# Patient Record
Sex: Male | Born: 1950 | Race: White | Hispanic: No | Marital: Married | Smoking: Never smoker
Health system: Southern US, Community
[De-identification: ages and names within clinical notes are randomized; demographics above are authoritative.]

## PROBLEM LIST (undated history)

## (undated) DIAGNOSIS — I2510 Atherosclerotic heart disease of native coronary artery without angina pectoris: Secondary | ICD-10-CM

## (undated) HISTORY — PX: CARDIAC SURGERY: SHX584

---

## 2019-08-21 ENCOUNTER — Emergency Department (HOSPITAL_COMMUNITY)
Admission: EM | Admit: 2019-08-21 | Discharge: 2019-08-22 | Disposition: A | Discharge status: Home / Self Care | Payer: Medicare Other | Attending: Emergency Medicine | Admitting: Emergency Medicine

## 2019-08-21 ENCOUNTER — Other Ambulatory Visit: Payer: Self-pay

## 2019-08-21 ENCOUNTER — Encounter (HOSPITAL_COMMUNITY): Payer: Self-pay | Admitting: Emergency Medicine

## 2019-08-21 DIAGNOSIS — H81393 Other peripheral vertigo, bilateral: Secondary | ICD-10-CM | POA: Diagnosis not present

## 2019-08-21 DIAGNOSIS — R110 Nausea: Secondary | ICD-10-CM | POA: Diagnosis not present

## 2019-08-21 DIAGNOSIS — J302 Other seasonal allergic rhinitis: Secondary | ICD-10-CM | POA: Insufficient documentation

## 2019-08-21 DIAGNOSIS — I2510 Atherosclerotic heart disease of native coronary artery without angina pectoris: Secondary | ICD-10-CM | POA: Insufficient documentation

## 2019-08-21 DIAGNOSIS — H538 Other visual disturbances: Secondary | ICD-10-CM | POA: Diagnosis not present

## 2019-08-21 DIAGNOSIS — R42 Dizziness and giddiness: Secondary | ICD-10-CM

## 2019-08-21 DIAGNOSIS — H5589 Other irregular eye movements: Secondary | ICD-10-CM | POA: Insufficient documentation

## 2019-08-21 HISTORY — DX: Atherosclerotic heart disease of native coronary artery without angina pectoris: I25.10

## 2019-08-21 LAB — CBC WITH DIFFERENTIAL/PLATELET
Abs Immature Granulocytes: 0.03 10*3/uL (ref 0.00–0.07)
Basophils Absolute: 0.1 10*3/uL (ref 0.0–0.1)
Basophils Relative: 1 %
Eosinophils Absolute: 0.1 10*3/uL (ref 0.0–0.5)
Eosinophils Relative: 1 %
HCT: 51 % (ref 39.0–52.0)
Hemoglobin: 16.7 g/dL (ref 13.0–17.0)
Immature Granulocytes: 0 %
Lymphocytes Relative: 13 %
Lymphs Abs: 1.3 10*3/uL (ref 0.7–4.0)
MCH: 30.8 pg (ref 26.0–34.0)
MCHC: 32.7 g/dL (ref 30.0–36.0)
MCV: 93.9 fL (ref 80.0–100.0)
Monocytes Absolute: 0.8 10*3/uL (ref 0.1–1.0)
Monocytes Relative: 8 %
Neutro Abs: 7.8 10*3/uL — ABNORMAL HIGH (ref 1.7–7.7)
Neutrophils Relative %: 77 %
Platelets: 217 10*3/uL (ref 150–400)
RBC: 5.43 MIL/uL (ref 4.22–5.81)
RDW: 13.3 % (ref 11.5–15.5)
WBC: 10 10*3/uL (ref 4.0–10.5)
nRBC: 0 % (ref 0.0–0.2)

## 2019-08-21 LAB — COMPREHENSIVE METABOLIC PANEL
ALT: 32 U/L (ref 0–44)
AST: 27 U/L (ref 15–41)
Albumin: 3.8 g/dL (ref 3.5–5.0)
Alkaline Phosphatase: 79 U/L (ref 38–126)
Anion gap: 13 (ref 5–15)
BUN: 17 mg/dL (ref 8–23)
CO2: 22 mmol/L (ref 22–32)
Calcium: 9.4 mg/dL (ref 8.9–10.3)
Chloride: 105 mmol/L (ref 98–111)
Creatinine, Ser: 1.32 mg/dL — ABNORMAL HIGH (ref 0.61–1.24)
GFR calc Af Amer: >60 mL/min (ref >60)
GFR calc non Af Amer: 54 mL/min — ABNORMAL LOW (ref >60)
Glucose, Bld: 98 mg/dL (ref 70–99)
Potassium: 3.7 mmol/L (ref 3.5–5.1)
Sodium: 140 mmol/L (ref 135–145)
Total Bilirubin: 1.1 mg/dL (ref 0.3–1.2)
Total Protein: 6.4 g/dL — ABNORMAL LOW (ref 6.5–8.1)

## 2019-08-21 LAB — TROPONIN I (HIGH SENSITIVITY): Troponin I (High Sensitivity): 3 ng/L (ref <18)

## 2019-08-21 MED ORDER — ONDANSETRON HCL 4 MG/2ML IJ SOLN
4.0000 mg | Freq: Once | INTRAMUSCULAR | Status: AC
  Administered 2019-08-21: 23:00:00 4 mg via INTRAVENOUS
  Filled 2019-08-21: qty 2

## 2019-08-21 MED ORDER — SODIUM CHLORIDE 0.9 % IV BOLUS
500.0000 mL | Freq: Once | INTRAVENOUS | Status: AC
  Administered 2019-08-21: 500 mL via INTRAVENOUS

## 2019-08-21 MED ORDER — MECLIZINE HCL 25 MG PO TABS
25.0000 mg | ORAL_TABLET | Freq: Once | ORAL | Status: AC
  Administered 2019-08-21: 25 mg via ORAL
  Filled 2019-08-21: qty 1

## 2019-08-21 NOTE — ED Notes (Signed)
Attempted ortho vitals, after sitting pt stated dizziness returned so he laid back down. Will try again soon.

## 2019-08-21 NOTE — ED Provider Notes (Signed)
[facility] EMERGENCY DEPARTMENT Provider Note   CSN: [id number] Arrival date & time: 08/21/19  1741     History Chief Complaint  Patient presents with  . Nausea  . Dizziness    Cameron Russell is a 69 y.o. male.  Patient to ED for evaluation of sudden onset dizziness described as surroundings moving, unable to focus. Symptoms associated with nausea without vomiting and diaphoresis. No headache, chest pain, SOB. 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 No history of vertigo. He denies lateralizing weakness. His symptoms are worse with position change, including simply moving his head side to side. No recent fever. No cough, congestion. He reports history of seasonal allergies with typical symptoms. No recent medication changes or new medications.   The history is provided by the patient and the spouse. No language interpreter was used.  Dizziness Associated symptoms: nausea   Associated symptoms: no chest pain, no headaches, no palpitations, no shortness of breath and no vomiting        Past Medical History:  Diagnosis Date  . Coronary artery disease     There are no problems to display for this patient.   Past Surgical History:  Procedure Laterality Date  . CARDIAC SURGERY         No family history on file.  Social History   Tobacco Use  . Smoking status: Never Smoker  . Smokeless tobacco: Never Used  Substance Use Topics  . Alcohol use: Yes  . Drug use: Never    Home Medications Prior to Admission medications   Not on File    Allergies    Patient has no allergy information on record.  Review of Systems   Review of Systems  Constitutional: Positive for diaphoresis. Negative for chills and fever.  HENT: Positive for sneezing. Negative for congestion.   Eyes:       He describes difficulty focusing but denies diplopia, blurred vision or vision loss.     Respiratory: Negative.  Negative for cough and shortness of breath.   Cardiovascular: Negative.  Negative for chest pain and palpitations.  Gastrointestinal: Positive for nausea. Negative for abdominal pain and vomiting.  Musculoskeletal: Negative.   Skin: Negative.   Neurological: Positive for dizziness. Negative for headaches.    Physical Exam Updated Vital Signs BP 140/73 (BP Location: Right Arm)   Pulse (!) 55   Temp 98.2 F (36.8 C) (Oral)   Resp 10   Ht  (1.803 m)   Wt 88.5 kg   SpO2 98%   BMI 27.20 kg/m   Physical Exam Vitals and nursing note reviewed.  Constitutional:      General: He is not in acute distress.    Appearance: Normal appearance.  HENT:     Head: Normocephalic and atraumatic.     Right Ear: Tympanic membrane normal.     Left Ear: Tympanic membrane normal.     Nose: Nose normal.     Mouth/Throat:     Mouth: Mucous membranes are moist.  Eyes:     Extraocular Movements: Extraocular movements intact.     Pupils: Pupils are equal, round, and reactive to light.  Neck:     Vascular: No carotid bruit.  Cardiovascular:     Rate and Rhythm: Normal rate and regular rhythm.     Heart sounds: No murmur.  Pulmonary:     Effort: Pulmonary effort is normal.  Breath sounds: No wheezing, rhonchi or rales.  Abdominal:     Palpations: Abdomen is soft.     Tenderness: There is no abdominal tenderness.  Musculoskeletal:        General: Normal range of motion.     Cervical back: Normal range of motion and neck supple.  Skin:    General: Skin is warm and dry.  Neurological:     Mental Status: He is alert.     Sensory: No sensory deficit.     Motor: No weakness.     Comments: CN's 3-12 grossly intact. Speech is clear and focused. No facial asymmetry. No lateralizing weakness. No deficits of coordination by finger-to-nose, heel-to-shin. Left nystagmus. Hints exam c/w peripheral vertigo.     ED Results / Procedures / Treatments   Labs (all labs  ordered are listed, but only abnormal results are displayed) Labs Reviewed  CBC WITH DIFFERENTIAL/PLATELET - Abnormal; Notable for the following components:      Result Value   Neutro Abs 7.8 (*)    All other components within normal limits  COMPREHENSIVE METABOLIC PANEL - Abnormal; Notable for the following components:   Creatinine, Ser 1.32 (*)    Total Protein 6.4 (*)    GFR calc non Af Amer 54 (*)    All other components within normal limits  URINALYSIS, ROUTINE W REFLEX MICROSCOPIC  TROPONIN I (HIGH SENSITIVITY)  TROPONIN I (HIGH SENSITIVITY)   Results for orders placed or performed during the hospital encounter of 08/21/19  CBC with Differential  Result Value Ref Range   WBC 10.0 4.0 - 10.5 K/uL   RBC 5.43 4.22 - 5.81 MIL/uL   Hemoglobin 16.7 13.0 - 17.0 g/dL   HCT 69.6 29.5 - 28.4 %   MCV 93.9 80.0 - 100.0 fL   MCH 30.8 26.0 - 34.0 pg   MCHC 32.7 30.0 - 36.0 g/dL   RDW 13.2 44.0 - 10.2 %   Platelets 217 150 - 400 K/uL   nRBC 0.0 0.0 - 0.2 %   Neutrophils Relative % 77 %   Neutro Abs 7.8 (H) 1.7 - 7.7 K/uL   Lymphocytes Relative 13 %   Lymphs Abs 1.3 0.7 - 4.0 K/uL   Monocytes Relative 8 %   Monocytes Absolute 0.8 0.1 - 1.0 K/uL   Eosinophils Relative 1 %   Eosinophils Absolute 0.1 0.0 - 0.5 K/uL   Basophils Relative 1 %   Basophils Absolute 0.1 0.0 - 0.1 K/uL   Immature Granulocytes 0 %   Abs Immature Granulocytes 0.03 0.00 - 0.07 K/uL  Comprehensive metabolic panel  Result Value Ref Range   Sodium 140 135 - 145 mmol/L   Potassium 3.7 3.5 - 5.1 mmol/L   Chloride 105 98 - 111 mmol/L   CO2 22 22 - 32 mmol/L   Glucose, Bld 98 70 - 99 mg/dL   BUN 17 8 - 23 mg/dL   Creatinine, Ser 7.25 (H) 0.61 - 1.24 mg/dL   Calcium 9.4 8.9 - 36.6 mg/dL   Total Protein 6.4 (L) 6.5 - 8.1 g/dL   Albumin 3.8 3.5 - 5.0 g/dL   AST 27 15 - 41 U/L   ALT 32 0 - 44 U/L   Alkaline Phosphatase 79 38 - 126 U/L   Total Bilirubin 1.1 0.3 - 1.2 mg/dL   GFR calc non Af Amer 54 (L) >60  mL/min   GFR calc Af Amer >60 >60 mL/min   Anion gap 13 5 - 15  Troponin I (  High Sensitivity)  Result Value Ref Range   Troponin I (High Sensitivity) 3 <18 ng/L    EKG None  Radiology No results found. MR ANGIO HEAD WO CONTRAST  Result Date: 08/22/2019 CLINICAL DATA:  Initial evaluation for acute vertigo. EXAM: MRI HEAD WITHOUT CONTRAST MRA HEAD WITHOUT CONTRAST MRA NECK WITHOUT AND WITH CONTRAST TECHNIQUE: Multiplanar, multiecho pulse sequences of the brain and surrounding structures were obtained without intravenous contrast. Angiographic images of the Circle of Willis were obtained using MRA technique without intravenous contrast. Angiographic images of the neck were obtained using MRA technique without and with intravenous contrast. Carotid stenosis measurements (when applicable) are obtained utilizing NASCET criteria, using the distal internal carotid diameter as the denominator. CONTRAST:  52mL GADAVIST GADOBUTROL 1 MMOL/ML IV SOLN COMPARISON:  None available. FINDINGS: MRI HEAD FINDINGS Brain: Moderately advanced cerebral atrophy. No significant cerebral white matter disease for age. No abnormal foci of restricted diffusion to suggest acute or subacute ischemia. Gray-white matter differentiation well maintained. No encephalomalacia to suggest chronic infarction. No foci of susceptibility artifact to suggest acute or chronic intracranial hemorrhage. No mass lesion, midline shift or mass effect. No hydrocephalus. No extra-axial fluid collection. Major dural sinuses are grossly patent. No made of an empty sella.  Midline structures intact and normal. Vascular: Major intracranial vascular flow voids well maintained and normal in appearance. Skull and upper cervical spine: Craniocervical junction normal. Visualized upper cervical spine within normal limits. Bone marrow signal intensity normal. No scalp soft tissue abnormality. Sinuses/Orbits: Globes and orbital soft tissues within normal limits.  Paranasal sinuses are clear. Trace left mastoid effusion, of doubtful significance. Inner ear structures normal. Other: None. MRA HEAD FINDINGS ANTERIOR CIRCULATION: Visualized distal cervical segments of the internal carotid arteries are widely patent with symmetric antegrade flow. Petrous segments widely patent. Mild atheromatous irregularity within the carotid siphons without flow-limiting stenosis. ICA termini well perfused. A1 segments patent bilaterally. Normal anterior communicating artery. Anterior cerebral arteries widely patent to their distal aspects without stenosis. No M1 stenosis or occlusion. Normal MCA bifurcations. Distal MCA branches well perfused and symmetric. Mild distal small vessel atheromatous irregularity. POSTERIOR CIRCULATION: Both vertebral arteries widely patent to the vertebrobasilar junction without stenosis. Left vertebral artery slightly dominant. Both picas patent proximally. Basilar widely patent to its distal aspect without stenosis. Superior cerebellar and posterior cerebral arteries widely patent bilaterally. No intracranial aneurysm. MRA NECK FINDINGS AORTIC ARCH: Visualized aortic arch of normal caliber with normal branch pattern. No flow-limiting stenosis about the origin of the great vessels. Visualized subclavian arteries widely patent. RIGHT CAROTID SYSTEM: Right common and internal carotid arteries widely patent without stenosis, dissection or occlusion. No significant atheromatous narrowing seen about the right carotid bifurcation. LEFT CAROTID SYSTEM: Left common and internal carotid arteries widely patent without stenosis, dissection or occlusion. No atheromatous narrowing seen about the left carotid bifurcation. VERTEBRAL ARTERIES: Both vertebral arteries arise from the subclavian arteries. Vertebral arteries widely patent within the neck without stenosis, dissection or occlusion. IMPRESSION: MRI HEAD IMPRESSION: 1. No acute intracranial abnormality. 2. Moderately  advanced cerebral atrophy. Otherwise unremarkable brain MRI. MRA HEAD IMPRESSION: 1. Negative intracranial MRA with no evidence for large vessel occlusion. No hemodynamically significant or correctable stenosis. Vertebrobasilar system widely patent. 2. Mild distal small vessel atheromatous irregularity. MRA NECK IMPRESSION: Normal MRA of the neck with wide patency of the major arterial vasculature of the neck. Electronically Signed   By: Rise Mu M.D.   On: 08/22/2019 05:06   MR Angiogram Neck W or Wo  Contrast  Result Date: 08/22/2019 CLINICAL DATA:  Initial evaluation for acute vertigo. EXAM: MRI HEAD WITHOUT CONTRAST MRA HEAD WITHOUT CONTRAST MRA NECK WITHOUT AND WITH CONTRAST TECHNIQUE: Multiplanar, multiecho pulse sequences of the brain and surrounding structures were obtained without intravenous contrast. Angiographic images of the Circle of Willis were obtained using MRA technique without intravenous contrast. Angiographic images of the neck were obtained using MRA technique without and with intravenous contrast. Carotid stenosis measurements (when applicable) are obtained utilizing NASCET criteria, using the distal internal carotid diameter as the denominator. CONTRAST:  9mL GADAVIST GADOBUTROL 1 MMOL/ML IV SOLN COMPARISON:  None available. FINDINGS: MRI HEAD FINDINGS Brain: Moderately advanced cerebral atrophy. No significant cerebral white matter disease for age. No abnormal foci of restricted diffusion to suggest acute or subacute ischemia. Gray-white matter differentiation well maintained. No encephalomalacia to suggest chronic infarction. No foci of susceptibility artifact to suggest acute or chronic intracranial hemorrhage. No mass lesion, midline shift or mass effect. No hydrocephalus. No extra-axial fluid collection. Major dural sinuses are grossly patent. No made of an empty sella.  Midline structures intact and normal. Vascular: Major intracranial vascular flow voids well maintained  and normal in appearance. Skull and upper cervical spine: Craniocervical junction normal. Visualized upper cervical spine within normal limits. Bone marrow signal intensity normal. No scalp soft tissue abnormality. Sinuses/Orbits: Globes and orbital soft tissues within normal limits. Paranasal sinuses are clear. Trace left mastoid effusion, of doubtful significance. Inner ear structures normal. Other: None. MRA HEAD FINDINGS ANTERIOR CIRCULATION: Visualized distal cervical segments of the internal carotid arteries are widely patent with symmetric antegrade flow. Petrous segments widely patent. Mild atheromatous irregularity within the carotid siphons without flow-limiting stenosis. ICA termini well perfused. A1 segments patent bilaterally. Normal anterior communicating artery. Anterior cerebral arteries widely patent to their distal aspects without stenosis. No M1 stenosis or occlusion. Normal MCA bifurcations. Distal MCA branches well perfused and symmetric. Mild distal small vessel atheromatous irregularity. POSTERIOR CIRCULATION: Both vertebral arteries widely patent to the vertebrobasilar junction without stenosis. Left vertebral artery slightly dominant. Both picas patent proximally. Basilar widely patent to its distal aspect without stenosis. Superior cerebellar and posterior cerebral arteries widely patent bilaterally. No intracranial aneurysm. MRA NECK FINDINGS AORTIC ARCH: Visualized aortic arch of normal caliber with normal branch pattern. No flow-limiting stenosis about the origin of the great vessels. Visualized subclavian arteries widely patent. RIGHT CAROTID SYSTEM: Right common and internal carotid arteries widely patent without stenosis, dissection or occlusion. No significant atheromatous narrowing seen about the right carotid bifurcation. LEFT CAROTID SYSTEM: Left common and internal carotid arteries widely patent without stenosis, dissection or occlusion. No atheromatous narrowing seen about the  left carotid bifurcation. VERTEBRAL ARTERIES: Both vertebral arteries arise from the subclavian arteries. Vertebral arteries widely patent within the neck without stenosis, dissection or occlusion. IMPRESSION: MRI HEAD IMPRESSION: 1. No acute intracranial abnormality. 2. Moderately advanced cerebral atrophy. Otherwise unremarkable brain MRI. MRA HEAD IMPRESSION: 1. Negative intracranial MRA with no evidence for large vessel occlusion. No hemodynamically significant or correctable stenosis. Vertebrobasilar system widely patent. 2. Mild distal small vessel atheromatous irregularity. MRA NECK IMPRESSION: Normal MRA of the neck with wide patency of the major arterial vasculature of the neck. Electronically Signed   By: Rise MuBenjamin  McClintock M.D.   On: 08/22/2019 05:06   MR BRAIN WO CONTRAST  Result Date: 08/22/2019 CLINICAL DATA:  Initial evaluation for acute vertigo. EXAM: MRI HEAD WITHOUT CONTRAST MRA HEAD WITHOUT CONTRAST MRA NECK WITHOUT AND WITH CONTRAST TECHNIQUE: Multiplanar, multiecho pulse sequences  of the brain and surrounding structures were obtained without intravenous contrast. Angiographic images of the Circle of Willis were obtained using MRA technique without intravenous contrast. Angiographic images of the neck were obtained using MRA technique without and with intravenous contrast. Carotid stenosis measurements (when applicable) are obtained utilizing NASCET criteria, using the distal internal carotid diameter as the denominator. CONTRAST:  95mL GADAVIST GADOBUTROL 1 MMOL/ML IV SOLN COMPARISON:  None available. FINDINGS: MRI HEAD FINDINGS Brain: Moderately advanced cerebral atrophy. No significant cerebral white matter disease for age. No abnormal foci of restricted diffusion to suggest acute or subacute ischemia. Gray-white matter differentiation well maintained. No encephalomalacia to suggest chronic infarction. No foci of susceptibility artifact to suggest acute or chronic intracranial hemorrhage.  No mass lesion, midline shift or mass effect. No hydrocephalus. No extra-axial fluid collection. Major dural sinuses are grossly patent. No made of an empty sella.  Midline structures intact and normal. Vascular: Major intracranial vascular flow voids well maintained and normal in appearance. Skull and upper cervical spine: Craniocervical junction normal. Visualized upper cervical spine within normal limits. Bone marrow signal intensity normal. No scalp soft tissue abnormality. Sinuses/Orbits: Globes and orbital soft tissues within normal limits. Paranasal sinuses are clear. Trace left mastoid effusion, of doubtful significance. Inner ear structures normal. Other: None. MRA HEAD FINDINGS ANTERIOR CIRCULATION: Visualized distal cervical segments of the internal carotid arteries are widely patent with symmetric antegrade flow. Petrous segments widely patent. Mild atheromatous irregularity within the carotid siphons without flow-limiting stenosis. ICA termini well perfused. A1 segments patent bilaterally. Normal anterior communicating artery. Anterior cerebral arteries widely patent to their distal aspects without stenosis. No M1 stenosis or occlusion. Normal MCA bifurcations. Distal MCA branches well perfused and symmetric. Mild distal small vessel atheromatous irregularity. POSTERIOR CIRCULATION: Both vertebral arteries widely patent to the vertebrobasilar junction without stenosis. Left vertebral artery slightly dominant. Both picas patent proximally. Basilar widely patent to its distal aspect without stenosis. Superior cerebellar and posterior cerebral arteries widely patent bilaterally. No intracranial aneurysm. MRA NECK FINDINGS AORTIC ARCH: Visualized aortic arch of normal caliber with normal branch pattern. No flow-limiting stenosis about the origin of the great vessels. Visualized subclavian arteries widely patent. RIGHT CAROTID SYSTEM: Right common and internal carotid arteries widely patent without stenosis,  dissection or occlusion. No significant atheromatous narrowing seen about the right carotid bifurcation. LEFT CAROTID SYSTEM: Left common and internal carotid arteries widely patent without stenosis, dissection or occlusion. No atheromatous narrowing seen about the left carotid bifurcation. VERTEBRAL ARTERIES: Both vertebral arteries arise from the subclavian arteries. Vertebral arteries widely patent within the neck without stenosis, dissection or occlusion. IMPRESSION: MRI HEAD IMPRESSION: 1. No acute intracranial abnormality. 2. Moderately advanced cerebral atrophy. Otherwise unremarkable brain MRI. MRA HEAD IMPRESSION: 1. Negative intracranial MRA with no evidence for large vessel occlusion. No hemodynamically significant or correctable stenosis. Vertebrobasilar system widely patent. 2. Mild distal small vessel atheromatous irregularity. MRA NECK IMPRESSION: Normal MRA of the neck with wide patency of the major arterial vasculature of the neck. Electronically Signed   By: Jeannine Boga M.D.   On: 08/22/2019 05:06    Procedures Procedures (including critical care time)  Medications Ordered in ED Medications  meclizine (ANTIVERT) tablet 25 mg (has no administration in time range)  ondansetron (ZOFRAN) injection 4 mg (has no administration in time range)  sodium chloride 0.9 % bolus 500 mL (has no administration in time range)    ED Course  I have reviewed the triage vital signs and the nursing notes.  Pertinent labs & imaging results that were available during my care of the patient were reviewed by me and considered in my medical decision making (see chart for details).    MDM Rules/Calculators/A&P                      Patient to ED with sudden onset dizziness, unable to focus, nausea, diaphoresis last night. No history of similar symptoms.   Patient's symptoms and exam c/w vertigo. Hints exam demonstrates consistent left nystagmus, indicating peripheral vertigo. Meclizine provided,  zofran for nausea. Will review labs when resulted and reassess after medication.   The patient continues to be symptomatic without improvement. He cannot tolerate sitting up for orthostatic VS due to symptoms. Valium ordered. Given degree of symptoms, will obtain MRi studies to rule out central vertigo, stroke.   MRI/MRA studies show no acute abnormalities confirming dx peripheral vertigo. Additional medications ordered but as multiple attempts at control with medication has failed, and patient's debilitating symptoms/dizziness, will consider admission if not improved.   Recheck after Benadryl and Reglan: the patient feels improved. He is able to sit up with significantly less dizziness. No nausea. He is able to stand with minimal assist. Requires stabilizing when taking steps but is able to walk. He and wife are comfortable with discharge home. Will Rx Reglan, advise 25 mg Benadryl every 4-6 hours, close PCP follow up. Strict return precautions discussed.    Final Clinical Impression(s) / ED Diagnoses Final diagnoses:  None   1. Peripheral Vertigo  Rx / DC Orders ED Discharge Orders    None       Danne Harbor 08/22/19 [id number]    Nira Conn, MD 08/22/19 [phone]

## 2019-08-21 NOTE — ED Triage Notes (Signed)
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  Pt s's he had sudden onset of nausea and weakness.  Pt denies chest pain.  EMS reports pt was diaphoretic on their arrival

## 2019-08-22 ENCOUNTER — Emergency Department (HOSPITAL_COMMUNITY): Payer: Medicare Other

## 2019-08-22 DIAGNOSIS — H81393 Other peripheral vertigo, bilateral: Secondary | ICD-10-CM | POA: Diagnosis not present

## 2019-08-22 IMAGING — MR MR MRA NECK WO/W CM
8 of 9 series · 39 of 48 positions shown · IV contrast (gadavist)
Comparison: None available.

CLINICAL DATA: Initial evaluation for acute vertigo.

EXAM:
MRI HEAD WITHOUT CONTRAST
MRA HEAD WITHOUT CONTRAST
MRA NECK WITHOUT AND WITH CONTRAST
TECHNIQUE: Multiplanar, multiecho pulse sequences of the brain and surrounding
structures were obtained without intravenous contrast. Angiographic
images of the Circle of Willis were obtained using MRA technique
without intravenous contrast. Angiographic images of the neck were
obtained using MRA technique without and with intravenous contrast.
Carotid stenosis measurements (when applicable) are obtained
utilizing NASCET criteria, using the distal internal carotid
diameter as the denominator.
CONTRAST:  9mL GADAVIST GADOBUTROL 1 MMOL/ML IV SOLN

[Series 7: tof_fl3d_tra_iso · axial · 0.6mm · 0.52mm/px · z∈[-211,-133]mm · 7 of 133 slices shown]
[im 1/133]
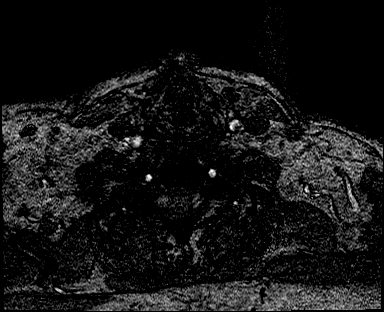
[im 23/133]
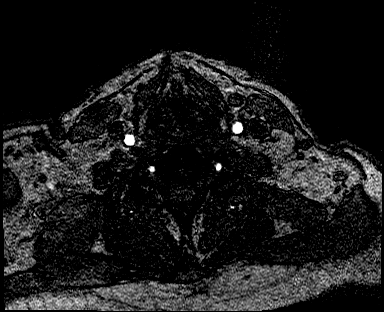
[im 45/133]
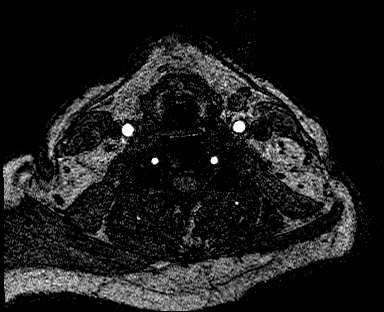
[im 67/133]
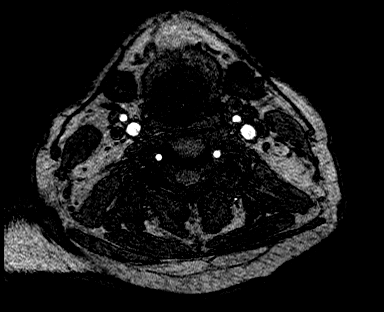
[im 89/133]
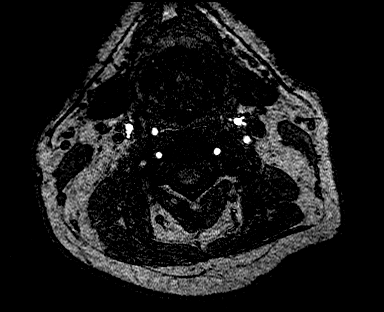
[im 111/133]
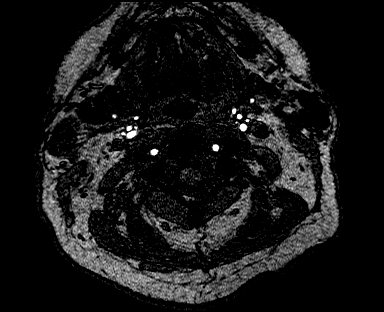
[im 133/133]
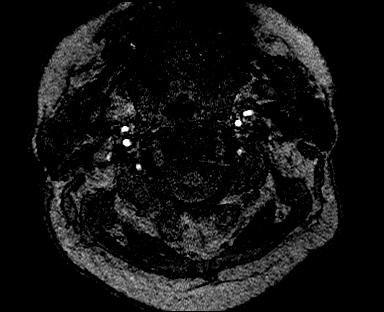

[Series 10: t1_se_fs_tra_blood-suppr. · axial · 4.0mm · 0.78mm/px · 1 of 22 slices shown]
[im 1/22]
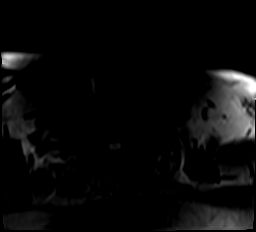

[Series 11: angio_fl3d_cor_pre_ttc=3.0s · coronal · 0.9mm · 0.85mm/px · 5 of 88 slices shown]
[im 1/88]
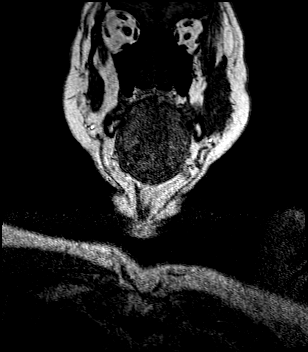
[im 22/88]
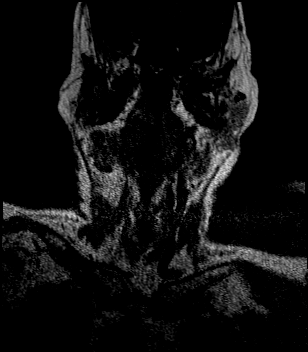
[im 44/88]
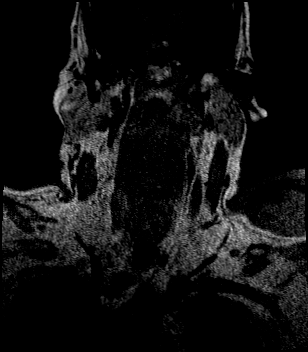
[im 66/88]
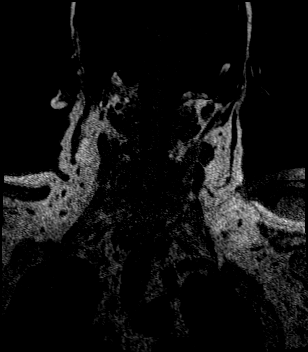
[im 88/88]
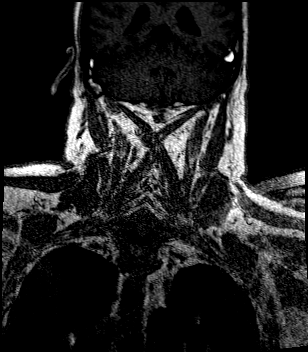

[Series 13: angio_fl3d_cor_post_ttc=3.0s · coronal · 0.9mm · 0.85mm/px · 6 of 88 slices shown (1 of 2)]
[im 1/88]
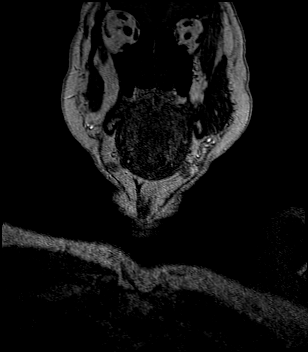
[im 18/88]
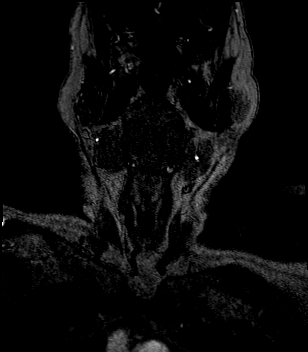
[im 35/88]
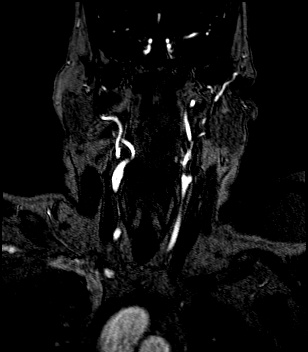
[im 53/88]
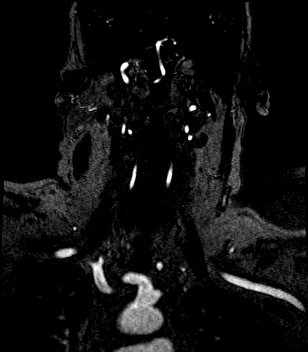
[im 70/88]
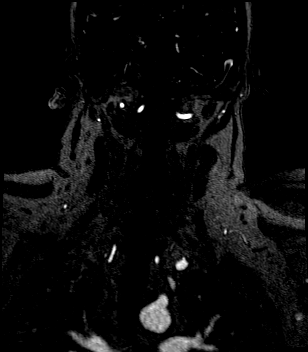
[im 88/88]
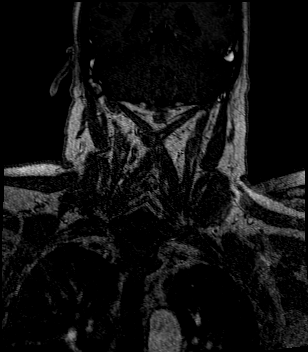

[Series 14: angio_fl3d_cor_post_ttc=3.0s_moco-adv · coronal · 0.9mm · 0.85mm/px · 6 of 88 slices shown (1 of 2)]
[im 1/88]
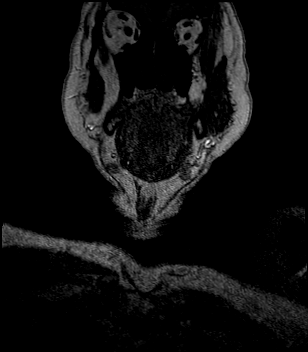
[im 18/88]
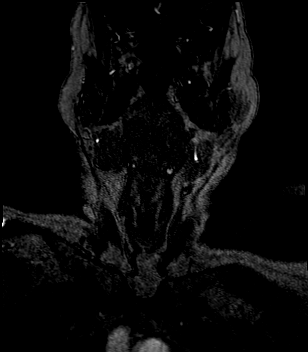
[im 35/88]
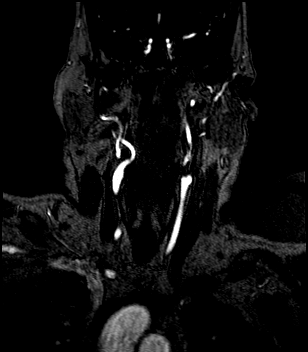
[im 53/88]
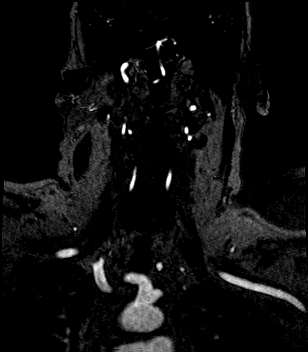
[im 70/88]
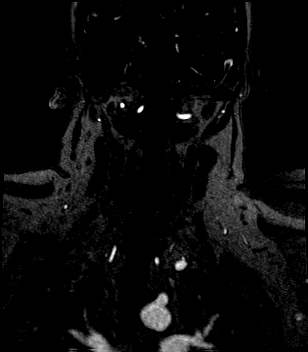
[im 88/88]
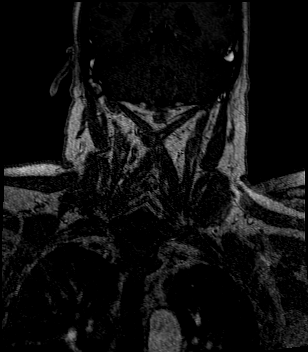

[Series 15: angio_fl3d_cor_post_ttc=3.0s_moco-adv_sub · coronal · 0.9mm · 0.85mm/px · 6 of 88 slices shown]
[im 1/88]
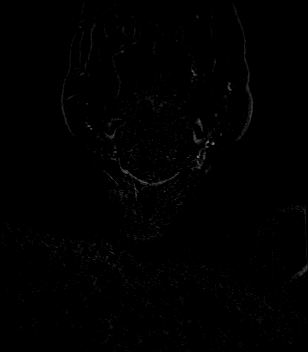
[im 18/88]
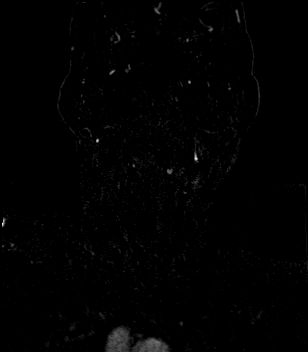
[im 35/88]
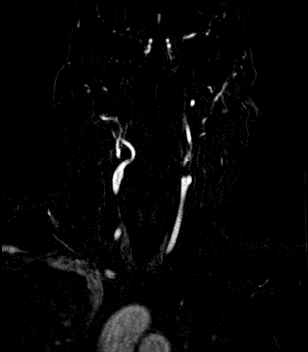
[im 53/88]
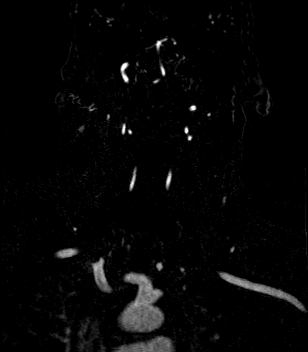
[im 70/88]
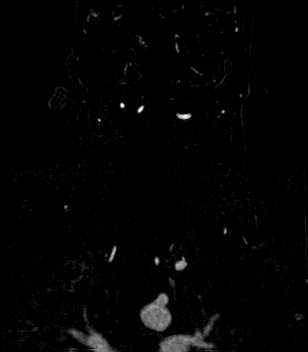
[im 88/88]
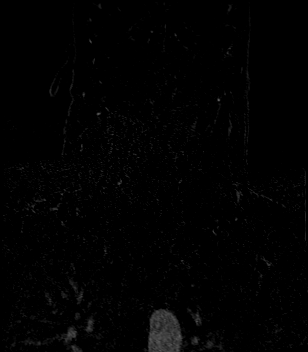

[Series 17: angio_fl3d_cor_post_ttc=3.0s · coronal · 0.9mm · 0.85mm/px · 6 of 88 slices shown (2 of 2)]
[im 1/88]
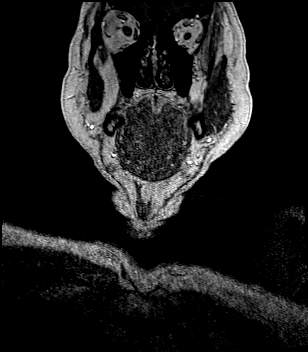
[im 18/88]
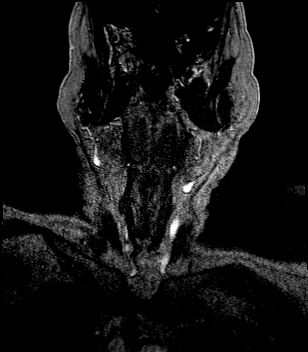
[im 35/88]
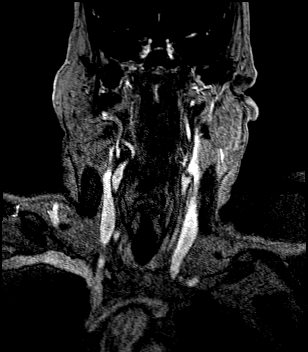
[im 53/88]
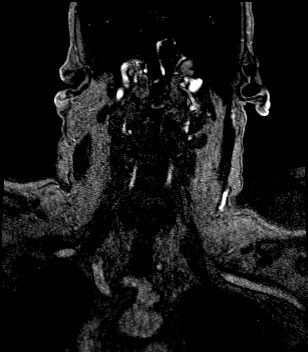
[im 70/88]
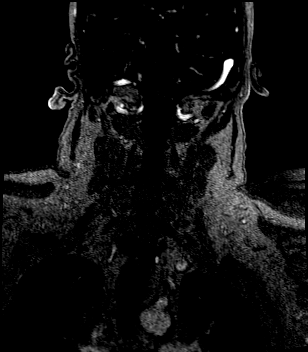
[im 88/88]
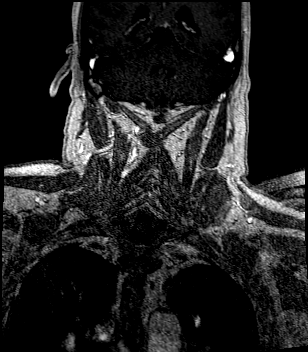

[Series 18: angio_fl3d_cor_post_ttc=3.0s_moco-adv · coronal · 0.9mm · 0.85mm/px · 2 of 88 slices shown (2 of 2)]
[im 1/88]
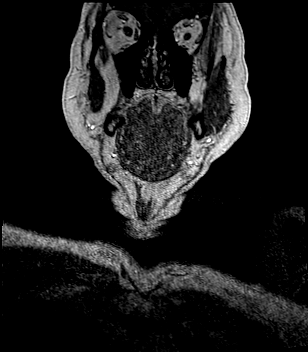
[im 18/88]
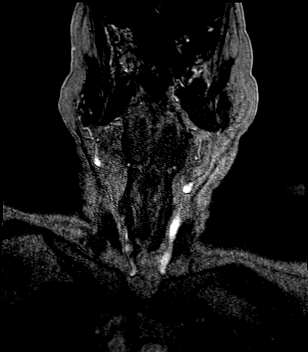

[39 of 48 positions shown; findings below may reference images not displayed]

FINDINGS: MRI HEAD FINDINGS

Brain: Moderately advanced cerebral atrophy. No significant cerebral
white matter disease for age.

No abnormal foci of restricted diffusion to suggest acute or
subacute ischemia. Gray-white matter differentiation well
maintained. No encephalomalacia to suggest chronic infarction. No
foci of susceptibility artifact to suggest acute or chronic
intracranial hemorrhage.

No mass lesion, midline shift or mass effect. No hydrocephalus. No
extra-axial fluid collection. Major dural sinuses are grossly
patent.

No made of an empty sella.  Midline structures intact and normal.

Vascular: Major intracranial vascular flow voids well maintained and
normal in appearance.

Skull and upper cervical spine: Craniocervical junction normal.
Visualized upper cervical spine within normal limits. Bone marrow
signal intensity normal. No scalp soft tissue abnormality.

Sinuses/Orbits: Globes and orbital soft tissues within normal
limits.

Paranasal sinuses are clear. Trace left mastoid effusion, of
doubtful significance. Inner ear structures normal.

Other: None.

MRA HEAD FINDINGS

ANTERIOR CIRCULATION:

Visualized distal cervical segments of the internal carotid arteries
are widely patent with symmetric antegrade flow. Petrous segments
widely patent. Mild atheromatous irregularity within the carotid
siphons without flow-limiting stenosis. ICA termini well perfused.
A1 segments patent bilaterally. Normal anterior communicating
artery. Anterior cerebral arteries widely patent to their distal
aspects without stenosis. No M1 stenosis or occlusion. Normal MCA
bifurcations. Distal MCA branches well perfused and symmetric. Mild
distal small vessel atheromatous irregularity.

POSTERIOR CIRCULATION:

Both vertebral arteries widely patent to the vertebrobasilar
junction without stenosis. Left vertebral artery slightly dominant.
Both picas patent proximally. Basilar widely patent to its distal
aspect without stenosis. Superior cerebellar and posterior cerebral
arteries widely patent bilaterally.

No intracranial aneurysm.

MRA NECK FINDINGS

AORTIC ARCH: Visualized aortic arch of normal caliber with normal
branch pattern. No flow-limiting stenosis about the origin of the
great vessels. Visualized subclavian arteries widely patent.

RIGHT CAROTID SYSTEM: Right common and internal carotid arteries
widely patent without stenosis, dissection or occlusion. No
significant atheromatous narrowing seen about the right carotid
bifurcation.

LEFT CAROTID SYSTEM: Left common and internal carotid arteries
widely patent without stenosis, dissection or occlusion. No
atheromatous narrowing seen about the left carotid bifurcation.

VERTEBRAL ARTERIES: Both vertebral arteries arise from the
subclavian arteries. Vertebral arteries widely patent within the
neck without stenosis, dissection or occlusion.
IMPRESSION: MRI HEAD IMPRESSION:

1. No acute intracranial abnormality.
2. Moderately advanced cerebral atrophy. Otherwise unremarkable
brain MRI.

MRA HEAD IMPRESSION:

1. Negative intracranial MRA with no evidence for large vessel
occlusion. No hemodynamically significant or correctable stenosis.
Vertebrobasilar system widely patent.
2. Mild distal small vessel atheromatous irregularity.

MRA NECK IMPRESSION:

Normal MRA of the neck with wide patency of the major arterial
vasculature of the neck.

## 2019-08-22 IMAGING — MR MR MRA HEAD W/O CM
1 series · 17 of 48 positions shown · IV contrast (gadavist)
Comparison: None available.

CLINICAL DATA: Initial evaluation for acute vertigo.

EXAM:
MRI HEAD WITHOUT CONTRAST
MRA HEAD WITHOUT CONTRAST
MRA NECK WITHOUT AND WITH CONTRAST
TECHNIQUE: Multiplanar, multiecho pulse sequences of the brain and surrounding
structures were obtained without intravenous contrast. Angiographic
images of the Circle of Willis were obtained using MRA technique
without intravenous contrast. Angiographic images of the neck were
obtained using MRA technique without and with intravenous contrast.
Carotid stenosis measurements (when applicable) are obtained
utilizing NASCET criteria, using the distal internal carotid
diameter as the denominator.
CONTRAST:  9mL GADAVIST GADOBUTROL 1 MMOL/ML IV SOLN

[Series 5: 3d cow · axial · 0.5mm · 0.41mm/px · z∈[-100,-3]mm · 17 of 204 slices shown]
[im 1/204]
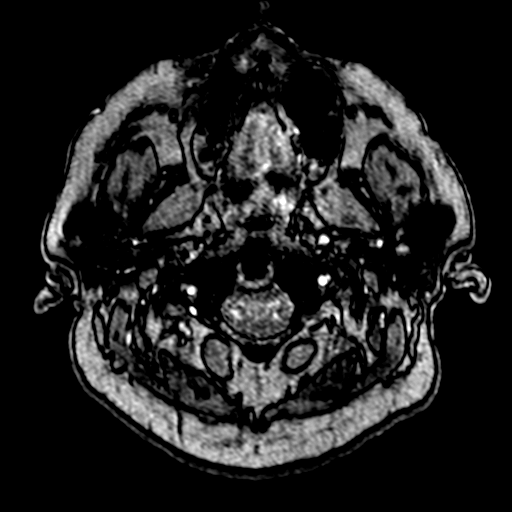
[im 5/204]
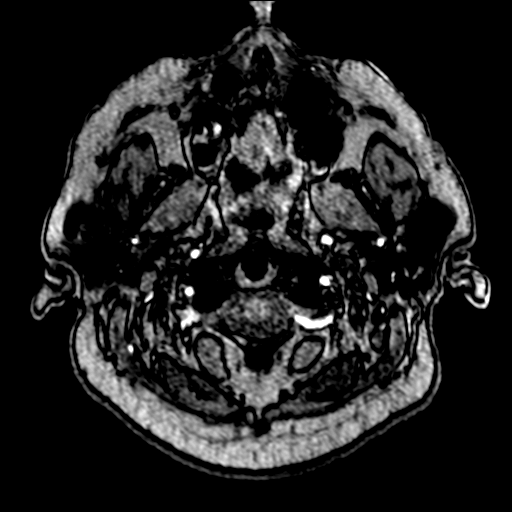
[im 9/204]
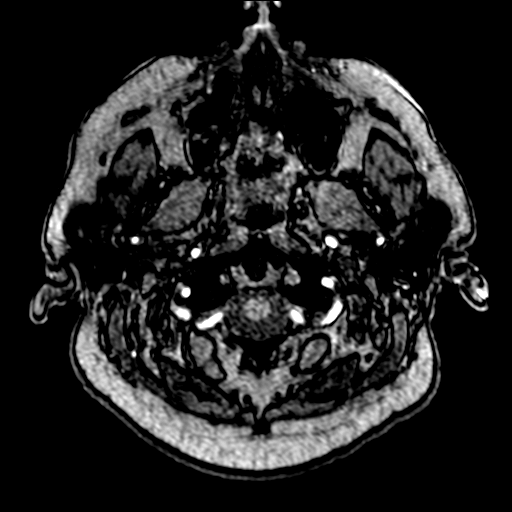
[im 13/204]
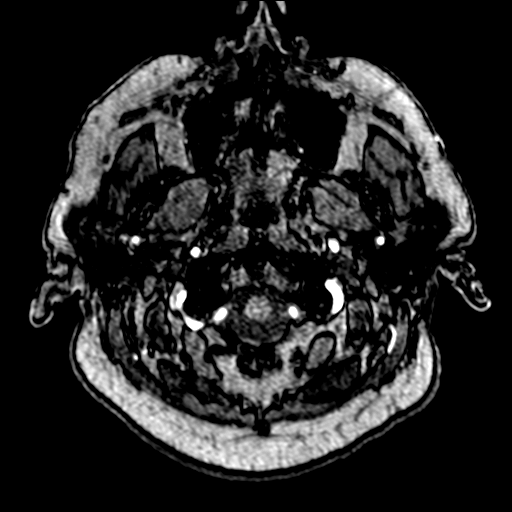
[im 18/204]
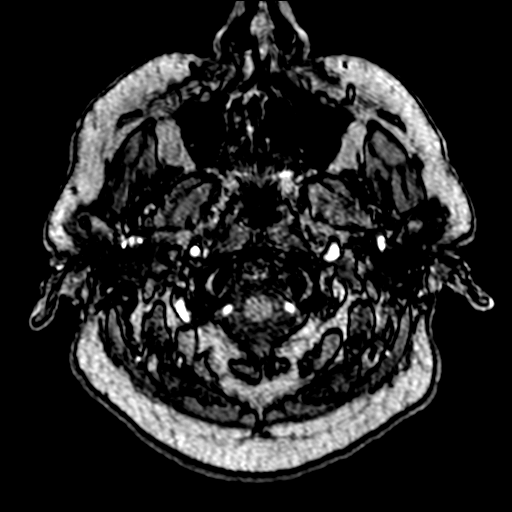
[im 22/204]
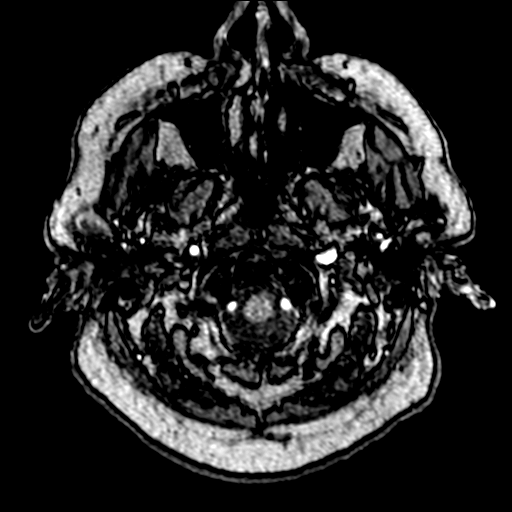
[im 26/204]
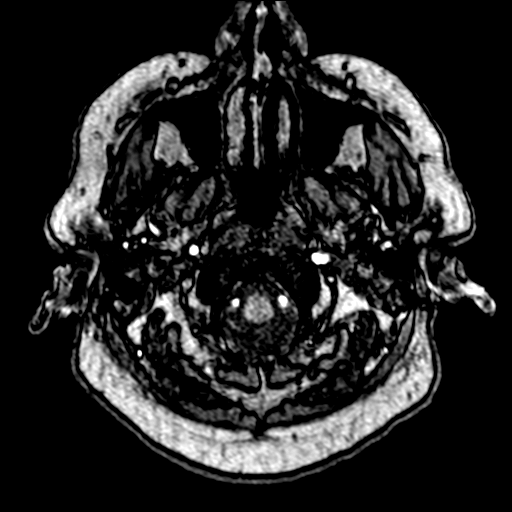
[im 35/204]
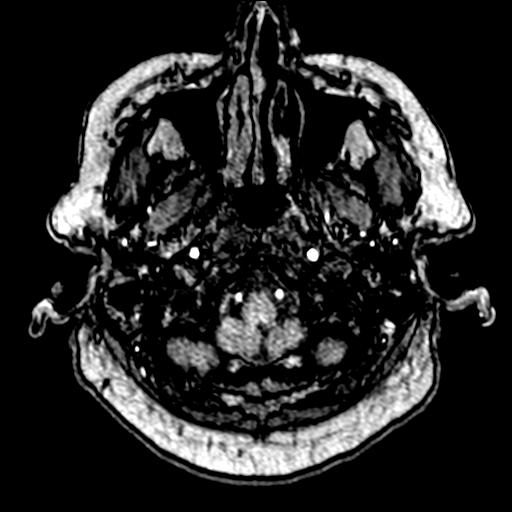
[im 39/204]
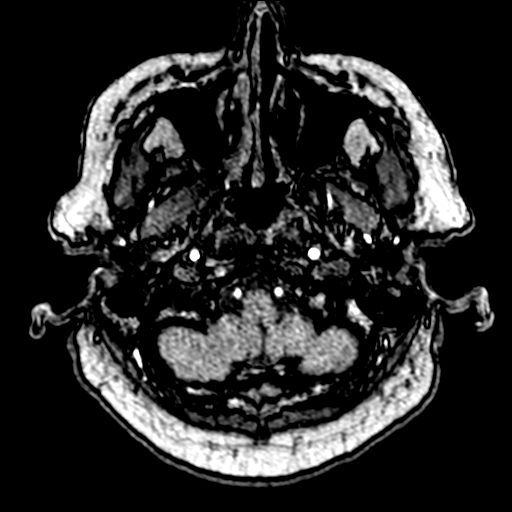
[im 65/204]
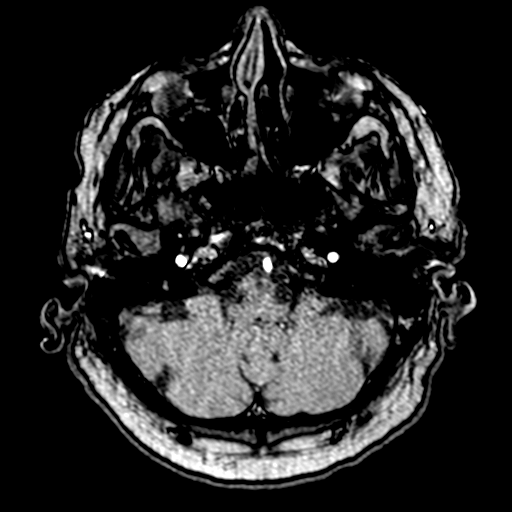
[im 91/204]
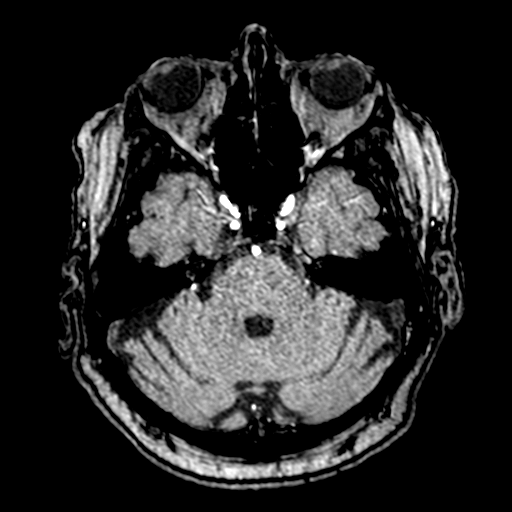
[im 104/204]
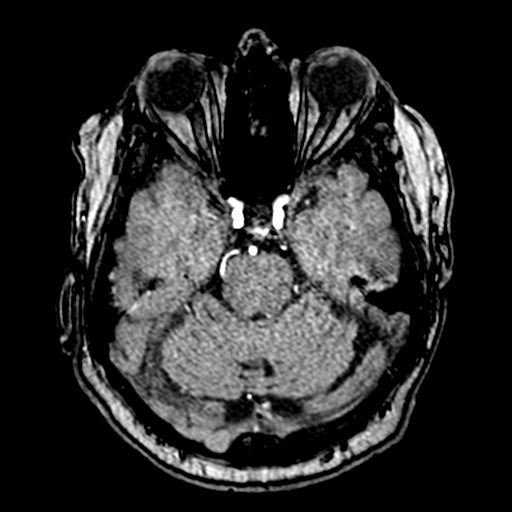
[im 117/204]
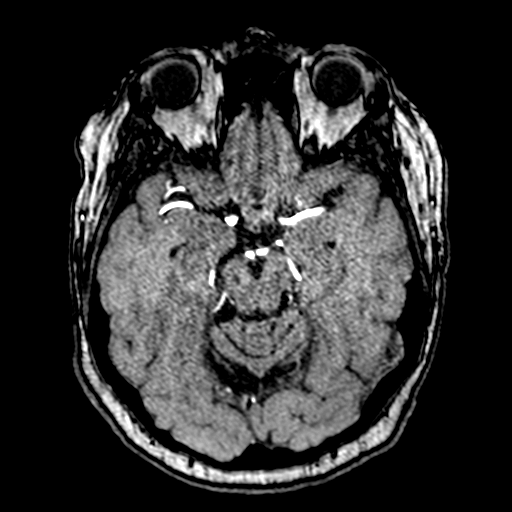
[im 143/204]
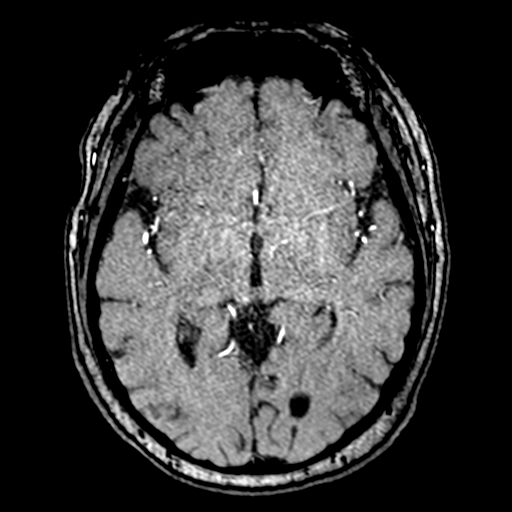
[im 169/204]
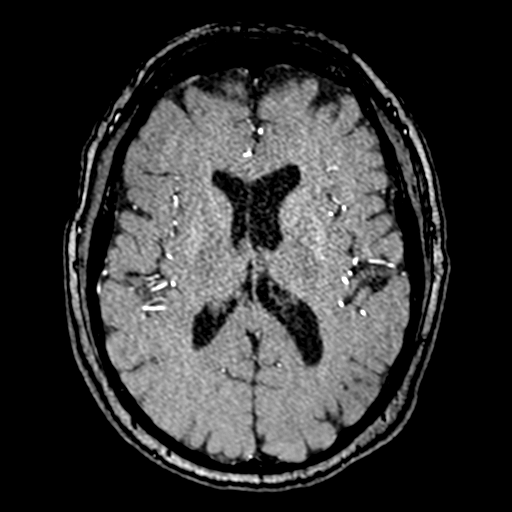
[im 173/204]
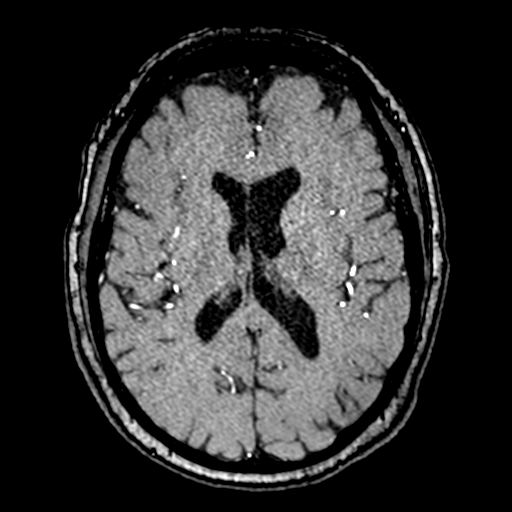
[im 195/204]
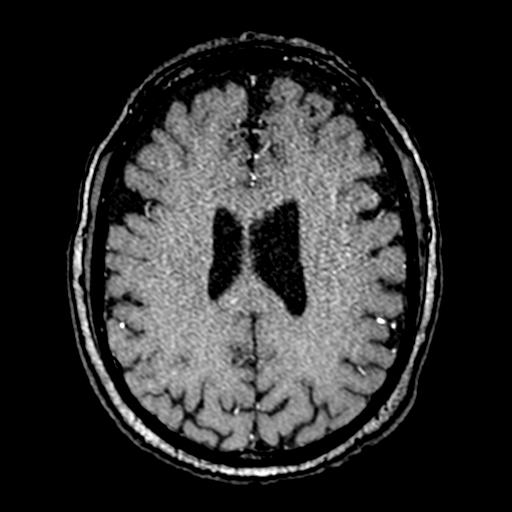

[17 of 48 positions shown; findings below may reference images not displayed]

FINDINGS: MRI HEAD FINDINGS

Brain: Moderately advanced cerebral atrophy. No significant cerebral
white matter disease for age.

No abnormal foci of restricted diffusion to suggest acute or
subacute ischemia. Gray-white matter differentiation well
maintained. No encephalomalacia to suggest chronic infarction. No
foci of susceptibility artifact to suggest acute or chronic
intracranial hemorrhage.

No mass lesion, midline shift or mass effect. No hydrocephalus. No
extra-axial fluid collection. Major dural sinuses are grossly
patent.

No made of an empty sella.  Midline structures intact and normal.

Vascular: Major intracranial vascular flow voids well maintained and
normal in appearance.

Skull and upper cervical spine: Craniocervical junction normal.
Visualized upper cervical spine within normal limits. Bone marrow
signal intensity normal. No scalp soft tissue abnormality.

Sinuses/Orbits: Globes and orbital soft tissues within normal
limits.

Paranasal sinuses are clear. Trace left mastoid effusion, of
doubtful significance. Inner ear structures normal.

Other: None.

MRA HEAD FINDINGS

ANTERIOR CIRCULATION:

Visualized distal cervical segments of the internal carotid arteries
are widely patent with symmetric antegrade flow. Petrous segments
widely patent. Mild atheromatous irregularity within the carotid
siphons without flow-limiting stenosis. ICA termini well perfused.
A1 segments patent bilaterally. Normal anterior communicating
artery. Anterior cerebral arteries widely patent to their distal
aspects without stenosis. No M1 stenosis or occlusion. Normal MCA
bifurcations. Distal MCA branches well perfused and symmetric. Mild
distal small vessel atheromatous irregularity.

POSTERIOR CIRCULATION:

Both vertebral arteries widely patent to the vertebrobasilar
junction without stenosis. Left vertebral artery slightly dominant.
Both picas patent proximally. Basilar widely patent to its distal
aspect without stenosis. Superior cerebellar and posterior cerebral
arteries widely patent bilaterally.

No intracranial aneurysm.

MRA NECK FINDINGS

AORTIC ARCH: Visualized aortic arch of normal caliber with normal
branch pattern. No flow-limiting stenosis about the origin of the
great vessels. Visualized subclavian arteries widely patent.

RIGHT CAROTID SYSTEM: Right common and internal carotid arteries
widely patent without stenosis, dissection or occlusion. No
significant atheromatous narrowing seen about the right carotid
bifurcation.

LEFT CAROTID SYSTEM: Left common and internal carotid arteries
widely patent without stenosis, dissection or occlusion. No
atheromatous narrowing seen about the left carotid bifurcation.

VERTEBRAL ARTERIES: Both vertebral arteries arise from the
subclavian arteries. Vertebral arteries widely patent within the
neck without stenosis, dissection or occlusion.
IMPRESSION: MRI HEAD IMPRESSION:

1. No acute intracranial abnormality.
2. Moderately advanced cerebral atrophy. Otherwise unremarkable
brain MRI.

MRA HEAD IMPRESSION:

1. Negative intracranial MRA with no evidence for large vessel
occlusion. No hemodynamically significant or correctable stenosis.
Vertebrobasilar system widely patent.
2. Mild distal small vessel atheromatous irregularity.

MRA NECK IMPRESSION:

Normal MRA of the neck with wide patency of the major arterial
vasculature of the neck.

## 2019-08-22 IMAGING — MR MR HEAD W/O CM
12 of 13 series · 44 of 48 positions shown · IV contrast (gadavist)
Comparison: None available.

CLINICAL DATA: Initial evaluation for acute vertigo.

EXAM:
MRI HEAD WITHOUT CONTRAST
MRA HEAD WITHOUT CONTRAST
MRA NECK WITHOUT AND WITH CONTRAST
TECHNIQUE: Multiplanar, multiecho pulse sequences of the brain and surrounding
structures were obtained without intravenous contrast. Angiographic
images of the Circle of Willis were obtained using MRA technique
without intravenous contrast. Angiographic images of the neck were
obtained using MRA technique without and with intravenous contrast.
Carotid stenosis measurements (when applicable) are obtained
utilizing NASCET criteria, using the distal internal carotid
diameter as the denominator.
CONTRAST:  9mL GADAVIST GADOBUTROL 1 MMOL/ML IV SOLN

[Series 5: DWI · axial · 3.0mm · 0.88mm/px · z∈[-88,+52]mm · 8 of 96 slices shown (1 of 4)]
[im 1/96]
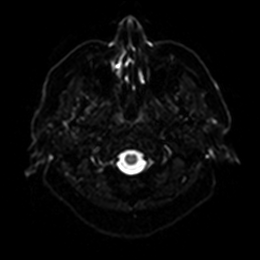
[im 14/96]
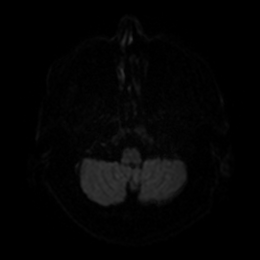
[im 28/96]
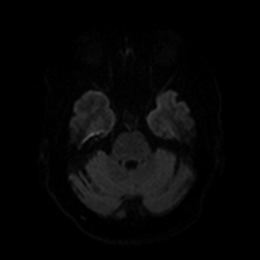
[im 41/96]
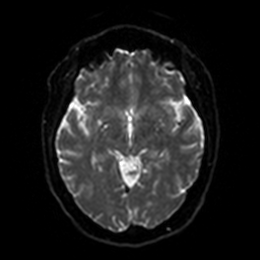
[im 55/96]
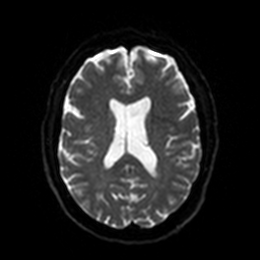
[im 68/96]
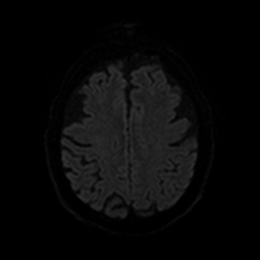
[im 82/96]
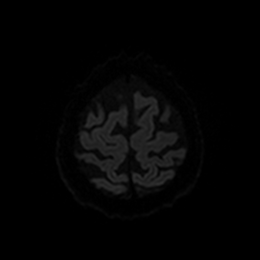
[im 96/96]
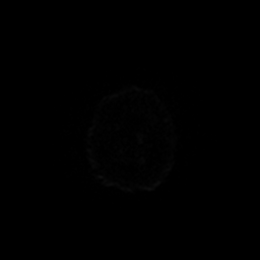

[Series 6: DWI · axial · 3.0mm · 0.88mm/px · z∈[-88,+52]mm · 4 of 48 slices shown (2 of 4)]
[im 1/48]
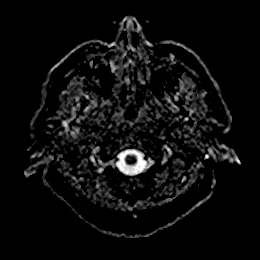
[im 16/48]
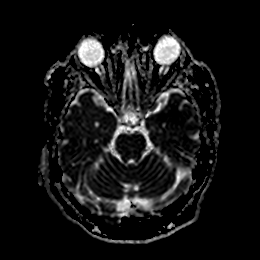
[im 32/48]
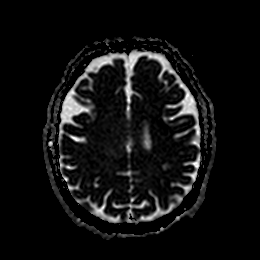
[im 48/48]
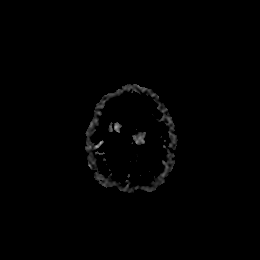

[Series 7: T1 · sagittal · 5.0mm · 0.72mm/px · 2 of 25 slices shown]
[im 1/25]
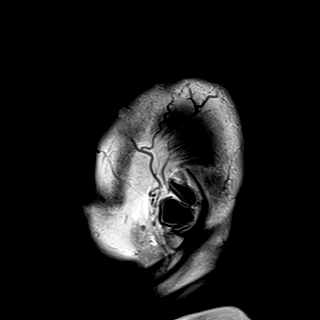
[im 25/25]
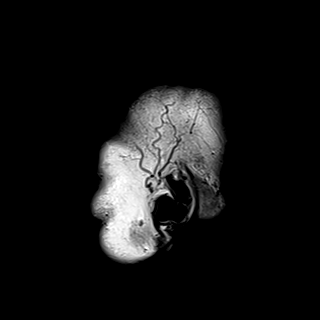

[Series 8: DWI · coronal · 4.0mm · 0.88mm/px · 5 of 66 slices shown (3 of 4)]
[im 1/66]
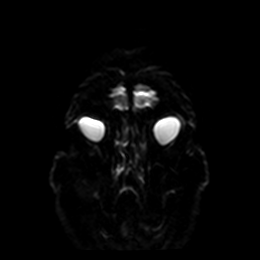
[im 17/66]
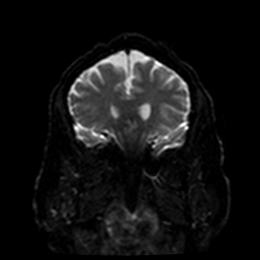
[im 33/66]
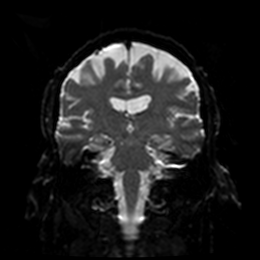
[im 49/66]
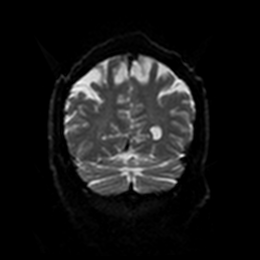
[im 66/66]
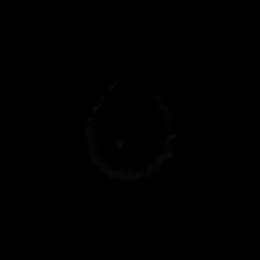

[Series 9: DWI · coronal · 4.0mm · 0.88mm/px · 3 of 33 slices shown (4 of 4)]
[im 1/33]
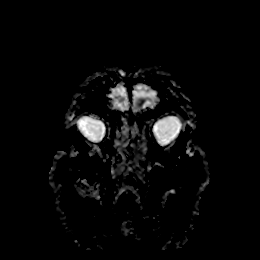
[im 17/33]
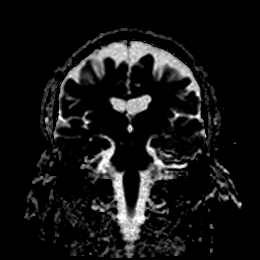
[im 33/33]
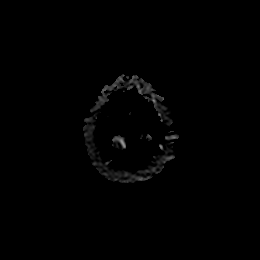

[Series 10: T2 · axial · 5.0mm · 0.72mm/px · z∈[-105,+45]mm · 2 of 26 slices shown (1 of 2)]
[im 1/26]
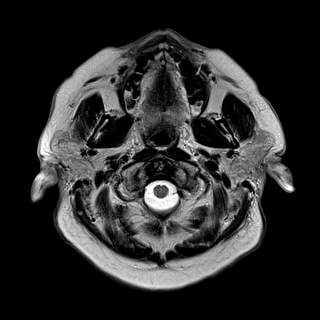
[im 26/26]
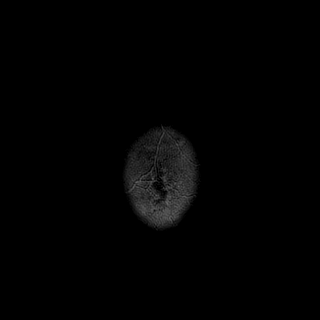

[Series 11: FLAIR · axial · 5.0mm · 0.45mm/px · z∈[-105,+45]mm · 2 of 26 slices shown]
[im 1/26]
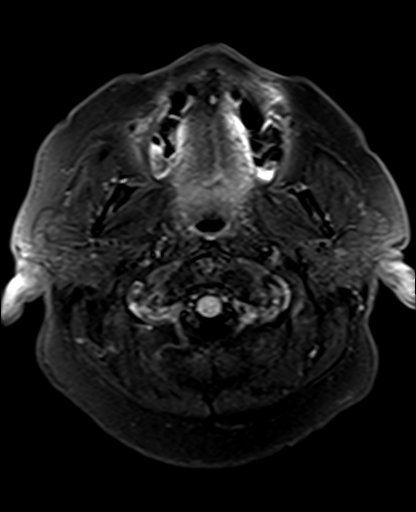
[im 26/26]
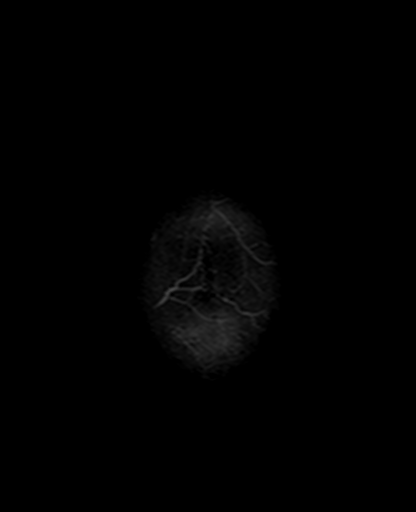

[Series 12: mag_images · axial · 3.0mm · 0.90mm/px · z∈[-115,+50]mm · 4 of 56 slices shown]
[im 1/56]
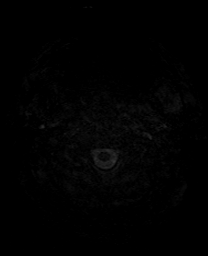
[im 19/56]
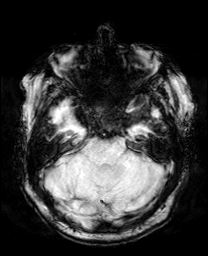
[im 37/56]
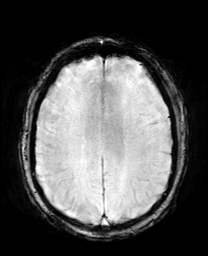
[im 56/56]
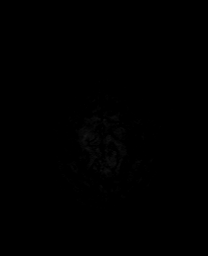

[Series 13: pha_images · axial · 3.0mm · 0.90mm/px · z∈[-115,+50]mm · 4 of 56 slices shown]
[im 1/56]
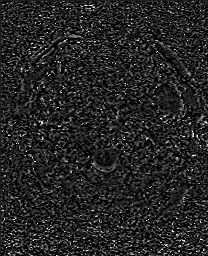
[im 19/56]
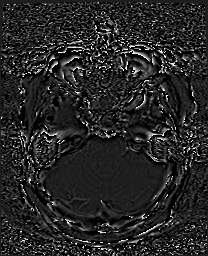
[im 37/56]
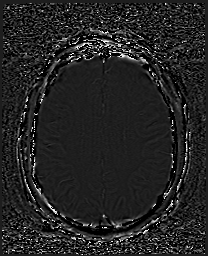
[im 56/56]
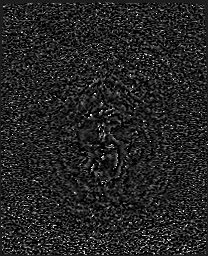

[Series 14: swi_images · axial · 3.0mm · 0.90mm/px · z∈[-115,+50]mm · 4 of 56 slices shown]
[im 1/56]
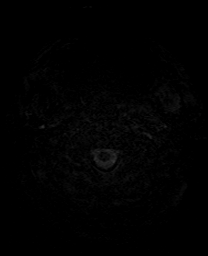
[im 19/56]
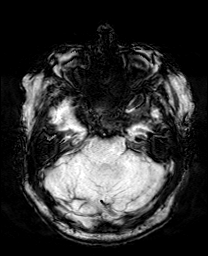
[im 37/56]
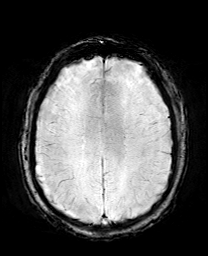
[im 56/56]
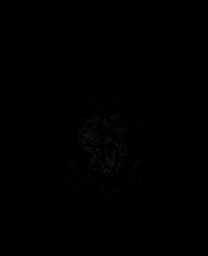

[Series 15: mip_images(sw) · axial · 24.0mm · 0.90mm/px · z∈[-105,+39]mm · 4 of 49 slices shown]
[im 1/49]
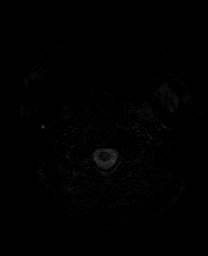
[im 17/49]
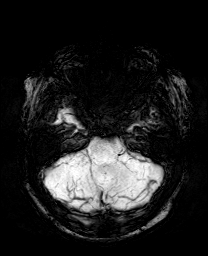
[im 33/49]
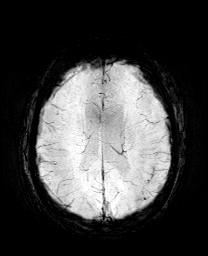
[im 49/49]
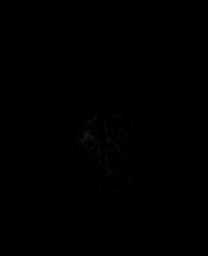

[Series 18: T2 · coronal · 5.0mm · 0.34mm/px · 2 of 29 slices shown (2 of 2)]
[im 1/29]
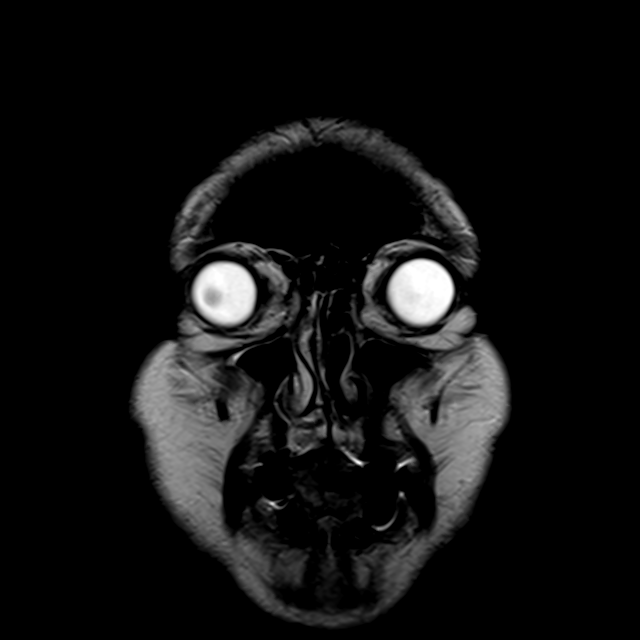
[im 29/29]
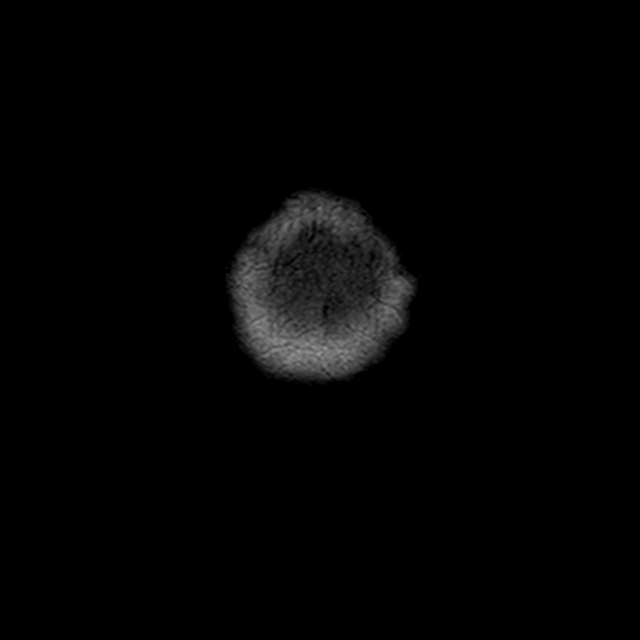

[44 of 48 positions shown; findings below may reference images not displayed]

FINDINGS: MRI HEAD FINDINGS

Brain: Moderately advanced cerebral atrophy. No significant cerebral
white matter disease for age.

No abnormal foci of restricted diffusion to suggest acute or
subacute ischemia. Gray-white matter differentiation well
maintained. No encephalomalacia to suggest chronic infarction. No
foci of susceptibility artifact to suggest acute or chronic
intracranial hemorrhage.

No mass lesion, midline shift or mass effect. No hydrocephalus. No
extra-axial fluid collection. Major dural sinuses are grossly
patent.

No made of an empty sella.  Midline structures intact and normal.

Vascular: Major intracranial vascular flow voids well maintained and
normal in appearance.

Skull and upper cervical spine: Craniocervical junction normal.
Visualized upper cervical spine within normal limits. Bone marrow
signal intensity normal. No scalp soft tissue abnormality.

Sinuses/Orbits: Globes and orbital soft tissues within normal
limits.

Paranasal sinuses are clear. Trace left mastoid effusion, of
doubtful significance. Inner ear structures normal.

Other: None.

MRA HEAD FINDINGS

ANTERIOR CIRCULATION:

Visualized distal cervical segments of the internal carotid arteries
are widely patent with symmetric antegrade flow. Petrous segments
widely patent. Mild atheromatous irregularity within the carotid
siphons without flow-limiting stenosis. ICA termini well perfused.
A1 segments patent bilaterally. Normal anterior communicating
artery. Anterior cerebral arteries widely patent to their distal
aspects without stenosis. No M1 stenosis or occlusion. Normal MCA
bifurcations. Distal MCA branches well perfused and symmetric. Mild
distal small vessel atheromatous irregularity.

POSTERIOR CIRCULATION:

Both vertebral arteries widely patent to the vertebrobasilar
junction without stenosis. Left vertebral artery slightly dominant.
Both picas patent proximally. Basilar widely patent to its distal
aspect without stenosis. Superior cerebellar and posterior cerebral
arteries widely patent bilaterally.

No intracranial aneurysm.

MRA NECK FINDINGS

AORTIC ARCH: Visualized aortic arch of normal caliber with normal
branch pattern. No flow-limiting stenosis about the origin of the
great vessels. Visualized subclavian arteries widely patent.

RIGHT CAROTID SYSTEM: Right common and internal carotid arteries
widely patent without stenosis, dissection or occlusion. No
significant atheromatous narrowing seen about the right carotid
bifurcation.

LEFT CAROTID SYSTEM: Left common and internal carotid arteries
widely patent without stenosis, dissection or occlusion. No
atheromatous narrowing seen about the left carotid bifurcation.

VERTEBRAL ARTERIES: Both vertebral arteries arise from the
subclavian arteries. Vertebral arteries widely patent within the
neck without stenosis, dissection or occlusion.
IMPRESSION: MRI HEAD IMPRESSION:

1. No acute intracranial abnormality.
2. Moderately advanced cerebral atrophy. Otherwise unremarkable
brain MRI.

MRA HEAD IMPRESSION:

1. Negative intracranial MRA with no evidence for large vessel
occlusion. No hemodynamically significant or correctable stenosis.
Vertebrobasilar system widely patent.
2. Mild distal small vessel atheromatous irregularity.

MRA NECK IMPRESSION:

Normal MRA of the neck with wide patency of the major arterial
vasculature of the neck.

## 2019-08-22 MED ORDER — METOCLOPRAMIDE HCL 10 MG PO TABS
10.0000 mg | ORAL_TABLET | Freq: Four times a day (QID) | ORAL | 0 refills | Status: AC
Start: 2019-08-22 — End: ?

## 2019-08-22 MED ORDER — GADOBUTROL 1 MMOL/ML IV SOLN
9.0000 mL | Freq: Once | INTRAVENOUS | Status: AC | PRN
  Administered 2019-08-22: 9 mL via INTRAVENOUS

## 2019-08-22 MED ORDER — DIPHENHYDRAMINE HCL 25 MG PO TABS: 25.0000 mg | ORAL_TABLET | Freq: Four times a day (QID) | ORAL | 0 refills | Status: AC | PRN

## 2019-08-22 MED ORDER — DIPHENHYDRAMINE HCL 50 MG/ML IJ SOLN
25.0000 mg | Freq: Once | INTRAMUSCULAR | Status: AC
  Administered 2019-08-22: 25 mg via INTRAVENOUS
  Filled 2019-08-22: qty 1

## 2019-08-22 MED ORDER — METOCLOPRAMIDE HCL 5 MG/ML IJ SOLN
10.0000 mg | Freq: Once | INTRAMUSCULAR | Status: AC
  Administered 2019-08-22: 10 mg via INTRAVENOUS
  Filled 2019-08-22: qty 2

## 2019-08-22 MED ORDER — DIAZEPAM 5 MG/ML IJ SOLN
5.0000 mg | Freq: Once | INTRAMUSCULAR | Status: AC
  Administered 2019-08-22: 5 mg via INTRAVENOUS
  Filled 2019-08-22: qty 2

## 2019-08-22 NOTE — Discharge Instructions (Signed)
Walk only with assistance until your symptoms are substantially better. Take medications as prescribed.   If your symptoms become worse, your nausea is uncontrolled, or if you develop new symptoms of concern, return to the emergency department for further treatment.

## 2019-08-22 NOTE — ED Notes (Addendum)
Sudden onset of dizziness this afternoon with extreme nausea no pain   No previous history

## 2019-08-22 NOTE — ED Notes (Addendum)
To mri 

## 2019-08-22 NOTE — ED Notes (Signed)
Attempted ortho vitals again, patient couldn't tolerate.

## 2019-08-22 NOTE — ED Provider Notes (Signed)
Attestation: Medical screening examination/treatment/procedure(s) were conducted as a shared visit with non-physician practitioner(s) and myself.  I personally evaluated the patient during the encounter.   Briefly, the patient is a 69 y.o. male here for sudden onset of vertiginous symptoms.  No other focal deficits or visual disturbance.  No chest pain or shortness of breath.  Vitals:   08/22/19 0522 08/22/19 0530  BP: 120/67 120/62  Pulse: (!) 58 (!) 54  Resp: 10   Temp:    SpO2: 92% 96%    CONSTITUTIONAL: Well-appearing, NAD NEURO:  Alert and oriented x 3, no focal deficits.  Unilateral nystagmus with fast beat to the left.  Patient also has nystagmus with vertical gaze but this is also with a fast beat to the left.  Abnormal head impulse.  Normal test of skew. EYES:  pupils equal and reactive ENT/NECK:  trachea midline, no JVD CARDIO: Regular rate, regular rhythm, well-perfused PULM: None labored breathing GI/GU:  Abdomin non-distended MSK/SPINE:  No gross deformities, no edema SKIN:  no rash, atraumatic PSYCH:  Appropriate speech and behavior   EKG Interpretation  Date/Time:  Sunday August 21 2019 18:27:02 EDT Ventricular Rate:  62 PR Interval:  180 QRS Duration: 124 QT Interval:  436 QTC Calculation: 442 R Axis:   -60 Text Interpretation: Normal sinus rhythm Left axis deviation Right bundle branch block Minimal voltage criteria for LVH, may be normal variant ( R in aVL ) Possible Lateral infarct , age undetermined Inferior infarct , age undetermined Abnormal ECG NO STEMI. No old tracing to compare Confirmed by Drema Pry [phone]) on 08/21/2019 10:57:41 PM       Hints exam most consistent with peripheral process however the nystagmus with vertical gaze is atypical.  Patient initially treated for peripheral vertigo with meclizine and IV fluids providing little relief.  MRI was obtained which was negative for acute stroke or dissection. Additional meds provided including  Valium, Bentyl and Reglan.  This did provide patient with relief.  This does appear to be peripheral vertigo.     Nira Conn, MD 08/22/19 [phone]
